# Patient Record
Sex: Male | Born: 2000 | Race: White | Hispanic: No | Marital: Single | Smoking: Never smoker
Health system: Southern US, Community
[De-identification: ages and names within clinical notes are randomized; demographics above are authoritative.]

## PROBLEM LIST (undated history)

## (undated) HISTORY — PX: TONSILLECTOMY: SUR1361

---
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
## 2021-07-01 ENCOUNTER — Other Ambulatory Visit (HOSPITAL_COMMUNITY): Payer: Self-pay

## 2021-07-01 MED ORDER — HYDROCODONE-ACETAMINOPHEN 10-325 MG PO TABS
ORAL_TABLET | ORAL | 0 refills | Status: AC
  Filled 2021-07-01: qty 12, 3d supply, fill #0

## 2021-07-01 MED ORDER — AMOXICILLIN 500 MG PO CAPS
ORAL_CAPSULE | ORAL | 0 refills | Status: AC
  Filled 2021-07-01: qty 15, 5d supply, fill #0

## 2021-08-19 ENCOUNTER — Emergency Department (HOSPITAL_BASED_OUTPATIENT_CLINIC_OR_DEPARTMENT_OTHER)
Admission: EM | Admit: 2021-08-19 | Discharge: 2021-08-19 | Disposition: A | Discharge status: Home / Self Care | Payer: No Typology Code available for payment source | Attending: Emergency Medicine | Admitting: Emergency Medicine

## 2021-08-19 ENCOUNTER — Encounter (HOSPITAL_BASED_OUTPATIENT_CLINIC_OR_DEPARTMENT_OTHER): Payer: Self-pay | Admitting: Emergency Medicine

## 2021-08-19 DIAGNOSIS — W268XXA Contact with other sharp object(s), not elsewhere classified, initial encounter: Secondary | ICD-10-CM | POA: Insufficient documentation

## 2021-08-19 DIAGNOSIS — S61012A Laceration without foreign body of left thumb without damage to nail, initial encounter: Secondary | ICD-10-CM | POA: Insufficient documentation

## 2021-08-19 DIAGNOSIS — S6992XA Unspecified injury of left wrist, hand and finger(s), initial encounter: Secondary | ICD-10-CM | POA: Diagnosis present

## 2021-08-19 MED ORDER — TETANUS-DIPHTH-ACELL PERTUSSIS 5-2.5-18.5 LF-MCG/0.5 IM SUSY: 0.5000 mL | PREFILLED_SYRINGE | Freq: Once | INTRAMUSCULAR | Status: DC

## 2021-08-19 MED ORDER — HYDROCODONE-ACETAMINOPHEN 5-325 MG PO TABS
1.0000 | ORAL_TABLET | Freq: Once | ORAL | Status: AC
  Administered 2021-08-19: 1 via ORAL
  Filled 2021-08-19: qty 1

## 2021-08-19 MED ORDER — LIDOCAINE-EPINEPHRINE (PF) 2 %-1:200000 IJ SOLN
10.0000 mL | Freq: Once | INTRAMUSCULAR | Status: AC
  Administered 2021-08-19: 10 mL
  Filled 2021-08-19: qty 20

## 2021-08-19 NOTE — ED Provider Notes (Signed)
[facility] EMERGENCY DEPT Provider Note   CSN: [id number] Arrival date & time: 08/19/21  1411     History  Chief Complaint  Patient presents with   Laceration    Gregory Chavez is a 21 y.o. male.  Patient with no pertinent past medical history presents today with complaints of left hand injury.  He states that earlier today he was slicing boxes with a box cutter and accidentally cut his left thumb.  Endorses pain and bleeding to same.  He is up-to-date on tetanus.  The history is provided by the patient. No language interpreter was used.  Laceration      Home Medications Prior to Admission medications   Medication Sig Start Date End Date Taking? Authorizing Provider  acetaminophen (TYLENOL) 325 MG tablet Take 2 tablets (650 mg total) by mouth every 6 (six) hours as needed. 10/31/15   Sherrilee Gilles, NP  acetaminophen (TYLENOL) 500 MG tablet Take 500 mg by mouth every 6 (six) hours as needed.    [provider]  amoxicillin (AMOXIL) 500 MG capsule Take 1 capsule by mouth 3 times a day until gone. 07/01/21     HYDROcodone-acetaminophen (NORCO) 10-325 MG tablet Take 1 tablet by mouth every 6 hours as needed for pain. 07/01/21     ibuprofen (ADVIL,MOTRIN) 600 MG tablet Take 1 tab PO Q6H x 1-2 days then Q6H prn pain 04/23/16   Lowanda Foster, NP      Allergies    Patient has no known allergies.    Review of Systems   Review of Systems  Skin:  Positive for wound.  All other systems reviewed and are negative.   Physical Exam Updated Vital Signs BP 127/74 (BP Location: Right Arm)   Pulse 93   Temp 98.6 F (37 C)   Resp 20   SpO2 100%  Physical Exam Vitals and nursing note reviewed.  Constitutional:      General: He is not in acute distress.    Appearance: Normal appearance. He is normal weight. He is not ill-appearing, toxic-appearing or diaphoretic.  HENT:     Head: Normocephalic and atraumatic.  Cardiovascular:     Rate and Rhythm:  Normal rate.  Pulmonary:     Effort: Pulmonary effort is normal. No respiratory distress.  Musculoskeletal:        General: Normal range of motion.     Cervical back: Normal range of motion.     Comments: 5/5 strength and sensation intact to the left hand. Able to flex and extend without difficulty.  Capillary refill less than 2 seconds.  Skin:    General: Skin is warm and dry.     Comments: 5 cm gaping laceration noted to the dorsal aspect of the base of the left thumb.  No active bleeding or signs of foreign body.  Neurological:     General: No focal deficit present.     Mental Status: He is alert.  Psychiatric:        Mood and Affect: Mood normal.        Behavior: Behavior normal.     ED Results / Procedures / Treatments   Labs (all labs ordered are listed, but only abnormal results are displayed) Labs Reviewed - No data to display  EKG None  Radiology No results found.  Procedures .Marland KitchenLaceration Repair  Date/Time: 08/19/2021 3:47 PM  Performed by: Silva Bandy, PA-C Authorized by: Silva Bandy, PA-C   Consent:    Consent obtained:  Verbal  Consent given by:  Patient   Risks, benefits, and alternatives were discussed: yes     Risks discussed:  Infection, pain, retained foreign body, need for additional repair, poor cosmetic result, tendon damage, nerve damage, poor wound healing and vascular damage   Alternatives discussed:  No treatment Universal protocol:    Procedure explained and questions answered to patient or proxy's satisfaction: yes     Patient identity confirmed:  Verbally with patient Anesthesia:    Anesthesia method:  Local infiltration   Local anesthetic:  Lidocaine 2% WITH epi Laceration details:    Location:  Finger   Finger location:  L thumb   Length (cm):  5   Depth (mm):  3 Treatment:    Area cleansed with:  Soap and water   Amount of cleaning:  Standard   Irrigation solution:  Sterile saline   Irrigation volume:  500 ml   Irrigation  method:  Pressure wash Skin repair:    Repair method:  Sutures   Suture size:  4-0   Suture material:  Prolene   Suture technique:  Simple interrupted   Number of sutures:  6 Approximation:    Approximation:  Close Repair type:    Repair type:  Simple Post-procedure details:    Dressing:  Antibiotic ointment and non-adherent dressing   Procedure completion:  Tolerated well, no immediate complications     Medications Ordered in ED Medications  lidocaine-EPINEPHrine (XYLOCAINE W/EPI) 2 %-1:200000 (PF) injection 10 mL (10 mLs Infiltration Given by Other 08/19/21 1455)  HYDROcodone-acetaminophen (NORCO/VICODIN) 5-325 MG per tablet 1 tablet (1 tablet Oral Given 08/19/21 1454)    ED Course/ Medical Decision Making/ A&P                           Medical Decision Making Risk Prescription drug management.   Patient presents today with with left thumb laceration from a box cutter earlier today.  Pressure irrigation performed of wound. Wound explored and base of wound visualized in a bloodless field without evidence of foreign body.  Laceration occurred < 8 hours prior to repair which was well tolerated.  Tdap up-to-date.  Pt has no comorbidities to effect normal wound healing. Pt discharged without antibiotics.  Discussed suture home care with patient and answered questions. Pt to follow-up for wound check and suture removal in 7 days; they are to return to the ED sooner for signs of infection. Pt is hemodynamically stable with no complaints prior to dc.  Patient is understanding and amenable with plan, discharged in stable condition.   This is a shared visit with supervising physician Dr. Rubin Payor who has independently evaluated patient & provided guidance in evaluation/management/disposition, in agreement with care   Final Clinical Impression(s) / ED Diagnoses Final diagnoses:  Laceration of left thumb without foreign body without damage to nail, initial encounter    Rx / DC Orders ED  Discharge Orders     None     An After Visit Summary was printed and given to the patient.     Vear Clock 08/19/21 1552    Benjiman Core, MD 08/20/21 [phone]

## 2021-08-19 NOTE — ED Triage Notes (Signed)
Pt reports slicing his hand while opening a box today. Laceration to palm of hand. Bleeding controlled at this time.

## 2021-08-19 NOTE — Discharge Instructions (Signed)
6 nonabsorbable sutures were placed in your left hand today.  These will need to come out in 7 to 10 days.  You can do this at urgent care, your primary care doctor, or you can return here to have this done.  In the interim, please shower normally and rinse with soap and water.  Please pat dry and do not scrub the wound.  Monitor for signs of infection, these would be reasons to return to the emergency department for further evaluation and management.  You may also take Tylenol/ibuprofen as needed for pain.  Return if development of any new or worsening symptoms.

## 2022-03-16 ENCOUNTER — Ambulatory Visit: Payer: 59 | Admitting: Cardiovascular Disease

## 2022-04-06 ENCOUNTER — Ambulatory Visit: Payer: 59 | Admitting: Cardiovascular Disease

## 2022-07-28 ENCOUNTER — Ambulatory Visit: Payer: 59 | Attending: Internal Medicine

## 2022-07-28 ENCOUNTER — Telehealth: Payer: Self-pay

## 2022-07-28 DIAGNOSIS — I471 Supraventricular tachycardia, unspecified: Secondary | ICD-10-CM

## 2022-07-28 NOTE — Telephone Encounter (Signed)
Pt Dr. Graciela Husbands, pt need a 14 day zio and follow up appointment. Monitor order placed and appointment scheduled for 09/08/22. Pt made aware

## 2022-08-19 DIAGNOSIS — I471 Supraventricular tachycardia, unspecified: Secondary | ICD-10-CM | POA: Diagnosis not present

## 2022-08-29 DIAGNOSIS — I471 Supraventricular tachycardia, unspecified: Secondary | ICD-10-CM | POA: Insufficient documentation

## 2022-09-08 ENCOUNTER — Encounter: Payer: Self-pay | Admitting: Internal Medicine

## 2022-09-08 ENCOUNTER — Telehealth: Payer: Self-pay | Admitting: Internal Medicine

## 2022-09-08 ENCOUNTER — Ambulatory Visit: Payer: 59 | Attending: Internal Medicine | Admitting: Internal Medicine

## 2022-09-08 VITALS — BP 122/76 | HR 87 | Ht 73.0 in | Wt 165.0 lb

## 2022-09-08 DIAGNOSIS — I471 Supraventricular tachycardia, unspecified: Secondary | ICD-10-CM | POA: Diagnosis not present

## 2022-09-08 NOTE — Telephone Encounter (Signed)
  Patient Consent for Virtual Visit        Gregory Chavez has provided verbal consent on 09/08/2022 for a virtual visit (video or telephone).   CONSENT FOR VIRTUAL VISIT FOR:  Gregory Chavez  By participating in this virtual visit I agree to the following:  I hereby voluntarily request, consent and authorize [facility] and its employed or contracted physicians, physician assistants, nurse practitioners or other licensed health care professionals (the Practitioner), to provide me with telemedicine health care services (the "Services") as deemed necessary by the treating Practitioner. I acknowledge and consent to receive the Services by the Practitioner via telemedicine. I understand that the telemedicine visit will involve communicating with the Practitioner through live audiovisual communication technology and the disclosure of certain medical information by electronic transmission. I acknowledge that I have been given the opportunity to request an in-person assessment or other available alternative prior to the telemedicine visit and am voluntarily participating in the telemedicine visit.  I understand that I have the right to withhold or withdraw my consent to the use of telemedicine in the course of my care at any time, without affecting my right to future care or treatment, and that the Practitioner or I may terminate the telemedicine visit at any time. I understand that I have the right to inspect all information obtained and/or recorded in the course of the telemedicine visit and may receive copies of available information for a reasonable fee.  I understand that some of the potential risks of receiving the Services via telemedicine include:  Delay or interruption in medical evaluation due to technological equipment failure or disruption; Information transmitted may not be sufficient (e.g. poor resolution of images) to allow for appropriate medical decision making by the  Practitioner; and/or  In rare instances, security protocols could fail, causing a breach of personal health information.  Furthermore, I acknowledge that it is my responsibility to provide information about my medical history, conditions and care that is complete and accurate to the best of my ability. I acknowledge that Practitioner's advice, recommendations, and/or decision may be based on factors not within their control, such as incomplete or inaccurate data provided by me or distortions of diagnostic images or specimens that may result from electronic transmissions. I understand that the practice of medicine is not an exact science and that Practitioner makes no warranties or guarantees regarding treatment outcomes. I acknowledge that a copy of this consent can be made available to me via my patient portal [facility] MyChart), or I can request a printed copy by calling the office of [facility].    I understand that my insurance will be billed for this visit.   I have read or had this consent read to me. I understand the contents of this consent, which adequately explains the benefits and risks of the Services being provided via telemedicine.  I have been provided ample opportunity to ask questions regarding this consent and the Services and have had my questions answered to my satisfaction. I give my informed consent for the services to be provided through the use of telemedicine in my medical care

## 2022-09-08 NOTE — Patient Instructions (Signed)
Medication Instructions:  The current medical regimen is effective;  continue present plan and medications.  *If you need a refill on your cardiac medications before your next appointment, please call your pharmacy*   Follow-Up: At [facility], you and your health needs are our priority.  As part of our continuing mission to provide you with exceptional heart care, we have created designated Provider Care Teams.  These Care Teams include your primary Cardiologist (physician) and Advanced Practice Providers (APPs -  Physician Assistants and Nurse Practitioners) who all work together to provide you with the care you need, when you need it.  We recommend signing up for the patient portal called "MyChart".  Sign up information is provided on this After Visit Summary.  MyChart is used to connect with patients for Virtual Visits (Telemedicine).  Patients are able to view lab/test results, encounter notes, upcoming appointments, etc.  Non-urgent messages can be sent to your provider as well.   To learn more about what you can do with MyChart, go to [email or web address].    Your next appointment:   3 month(s)  Provider:   Sherryl Manges, MD  (phone visit)  Other Instructions Recommended salt supplementation.  The goal is about 7-10 g of sodium, not sodium chloride, a day.  Salt supplements include oral ThermaTabs, buffered sodium preparation, SaltStick Vitassium,  Other options include NUUN.  This comes in pill form and is dissolved in liquids.  Other liquid preparations include liquid IV, Pedialyte advance care, TRI-oral Also discussed the role of compression wear including thigh sleeves and abdominal binders.  Calf compression is not specifically recommended.Marland Kitchen

## 2022-09-08 NOTE — Progress Notes (Signed)
ELECTROPHYSIOLOGY OFFICE  NOTE  Patient ID: Jamarrion Longaker, MRN: [id number], DOB/AGE: September 04, 2000 21 y.o. Admit date: (Not on file) Date of Consult: 09/08/2022  Primary Physician: Patient, No Pcp Per Primary Cardiologist: new     Braun Barno is a 22 y.o. male who is being seen today for the evaluation of palps at the request of His mom Misty Stanley).    HPI Sharan Ashmead is a 22 y.o. 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  Since that time he had no recurrent tachypalpitations at the previous rates i.e. about 200 but he has had episodes frequently of heart rates in the 130-150s.  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  They are associated with diaphoresis nausea flushing pallor and residual fatigue.  Some orthostatic intolerance.  Diet is fluid replete but salt deplete        History reviewed. No pertinent past medical history.    Surgical History:  Past Surgical History:  Procedure Laterality Date   TONSILLECTOMY       Home Meds: No outpatient medications have been marked as taking for the 09/08/22 encounter (Office Visit) with Duke Salvia, MD.    Allergies: No Known Allergies  Social History   Socioeconomic History   Marital status: Single    Spouse name: Not on file   Number of children: Not on file   Years of education: Not on file   Highest education level: Not on file  Occupational History   Not on file  Tobacco Use   Smoking status: Never   Smokeless tobacco: Never  Substance and Sexual Activity   Alcohol use: Not on file   Drug use: No   Sexual activity: Never    Birth control/protection: None  Other Topics Concern   Not on file  Social  History Narrative   Not on file   Social Determinants of Health   Financial Resource Strain: Not on file  Food Insecurity: Not on file  Transportation Needs: Not on file  Physical Activity: Not on file  Stress: Not on file  Social Connections: Not on file  Intimate Partner Violence: Not on file     History reviewed. No pertinent family history.   ROS:  Please see the history of present illness.     All other systems reviewed and negative.    Physical Exam:  Blood pressure 104/70, pulse 81, height 6\' 1"  (1.854 m), weight 165 lb (74.8 kg), SpO2 96%. General: Well developed, well nourished male in no acute distress. Head: Normocephalic, atraumatic, sclera non-icteric, no xanthomas, nares are without discharge. EENT: normal  Lymph Nodes:  none Neck: Negative for carotid bruits. JVD not elevated. Back:without scoliosis kyphosis Lungs: Clear bilaterally to auscultation without wheezes, rales, or rhonchi. Breathing is unlabored. Heart: RRR with S1 S2 no  murmur . No rubs, or gallops appreciated. Abdomen: Soft, non-tender, non-distended with normoactive bowel sounds. No hepatomegaly. No rebound/guarding. No obvious abdominal masses. Msk:  Strength and tone appear normal for age. Extremities: No clubbing or cyanosis. No  edema.  Distal pedal pulses are 2+ and equal bilaterally. Skin: Warm and Dry Neuro: Alert and oriented X 3. CN III-XII intact Grossly normal sensory and motor function .  Psych:  Responds to questions appropriately with a normal affect.        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  This is  reassuring  Patient has mild orthostatic intolerance objectively and symptoms consistent with autonomic insufficiency. We discussed extensively the issues of dysautonomia, the physiology of orthstasis and positional stress.  We discussed the role of salt and water repletion,   Discussed salt substitutes with a  goal of 3-4 gms of Sodium or 12-15 gm of sodium Chloride daily.  Rehydration solutions include Liquid IV, NUUN, TriOral, Normralyte pedialyte advanced care and Banana Bags.  Salt tablets include plain salt tablets, SaltStick Vitassium, thermatabs amongst others.   The higher sodium concentration per serving on this list is normalyte about 800 mg of sodium per serving LMNT1000 mg and most recently of worried about Within You hydration formula also at 1000 mg  Salt supplements are best used with adjunctive sugar  Also discussed the role of compression wear including thigh sleeves potentially in the form of compression pants particularly when he is working in the inside of the building        [employer]

## 2022-12-29 ENCOUNTER — Telehealth: Payer: 59 | Admitting: Internal Medicine

## 2023-03-10 ENCOUNTER — Other Ambulatory Visit: Payer: Self-pay

## 2023-03-10 ENCOUNTER — Ambulatory Visit: Payer: 59 | Attending: Internal Medicine

## 2023-03-10 DIAGNOSIS — I471 Supraventricular tachycardia, unspecified: Secondary | ICD-10-CM

## 2024-03-14 ENCOUNTER — Other Ambulatory Visit (HOSPITAL_COMMUNITY): Payer: Self-pay

## 2024-03-14 ENCOUNTER — Telehealth: Admitting: Emergency Medicine

## 2024-03-14 DIAGNOSIS — J309 Allergic rhinitis, unspecified: Secondary | ICD-10-CM

## 2024-03-14 MED ORDER — CETIRIZINE HCL 10 MG PO TABS
10.0000 mg | ORAL_TABLET | Freq: Every day | ORAL | 11 refills | Status: AC
  Filled 2024-03-14: qty 30, 30d supply, fill #0

## 2024-03-14 MED ORDER — ALBUTEROL SULFATE HFA 108 (90 BASE) MCG/ACT IN AERS
2.0000 | INHALATION_SPRAY | RESPIRATORY_TRACT | 3 refills | Status: AC | PRN
  Filled 2024-03-14: qty 6.7, 10d supply, fill #0
  Filled 2024-03-14: qty 6.7, 17d supply, fill #0

## 2024-03-14 MED ORDER — FLUTICASONE PROPIONATE 50 MCG/ACT NA SUSP
2.0000 | Freq: Every day | NASAL | 0 refills | Status: AC
  Filled 2024-03-14: qty 16, 60d supply, fill #0

## 2024-03-14 MED ORDER — BENZONATATE 100 MG PO CAPS
100.0000 mg | ORAL_CAPSULE | Freq: Two times a day (BID) | ORAL | 0 refills | Status: AC | PRN
  Filled 2024-03-14: qty 20, 10d supply, fill #0

## 2024-03-14 NOTE — Addendum Note (Signed)
 Addended by: VICKY LAMAR NOVAK on: 03/14/2024 08:54 AM   Modules accepted: Orders

## 2024-03-14 NOTE — Progress Notes (Signed)
 E visit for Allergic Rhinitis We are sorry that you are not feeling well.  Here is how we plan to help!  Based on what you have shared with me, it looks like you have Allergic Rhinitis.  Rhinitis is when a reaction occurs that causes nasal congestion, runny nose, sneezing, and itching.    There are several types of rhinitis, with the most common being acute rhinitis, allergic rhinitis, and nonallergic rhinitis. Acute rhinitis is typically caused by a viral infection. Allergic rhinitis, also known as seasonal rhinitis, occurs at certain times of the year when airborne allergens--such as pollen--trigger the immune system to release histamine. This chemical causes symptoms like itching, swelling, and fluid buildup in the delicate linings of the nasal passages, sinuses, and eyelids. As a result, individuals often experience an itchy nose and a clear nasal discharge. Nonallergic rhinitis, on the other hand, is not triggered by allergens and tends to persist year-round.   You should take a daily antihistamine. I have prescribed:Cetirizine  10 mg -- Take 1 tablet daily.  A nasal spray would also be beneficial. I have prescribed:Flonase  --  2 sprays into each nostril once daily.  You may also benefit from eye drops such as: Systane 1-2 drops each eye twice daily as needed (over-the-counter).  I've also sent an inhaler for wheezing.  HOME CARE:  You can use an over-the-counter saline nasal spray as needed. Avoid areas where there is heavy dust, mites, or molds. Stay indoors on windy days during the pollen season. Keep windows closed in home, at least in bedroom; use air conditioner. Keep house pets out of the bedroom or at least off of your bed. Use high-efficiency house air filters in the home. Keep windows closed in car, turn AC on re-circulate. Avoid playing outside with animals during peak pollen season.  GET HELP RIGHT AWAY IF:  If your symptoms do not improve within 10 days. You become short  of breath. You develop yellow or green discharge from your nose for over 3 days. You have coughing fits.  MAKE SURE YOU:  Understand these instructions. Monitor your symptoms and get help right away if anything continues to worsen despite treatment.  Thank you for choosing an e-visit.   Your e-visit answers were reviewed by a board certified advanced clinical practitioner to complete your personal care plan. Depending upon the condition, your plan could have included both over the counter or prescription medications.   Please review your pharmacy choice. Be sure that the pharmacy you have chosen is open so that you can pick up your prescription now.  If there is a problem, you may message your provider in MyChart to have the prescription routed to another pharmacy.   Your safety is important to us . If you have drug allergies, check your prescription carefully.    For the next 24 hours, you can use MyChart to ask questions about todays visit, request a non-urgent call back, or ask for a work or school excuse from your e-visit provider.   You will receive an email in the next two days asking about your experience. I hope that your e-visit has been valuable and will speed up your recovery.    I have spent 5 minutes in review of e-visit questionnaire, review and updating patient chart, medical decision making and response to patient.   Lamar Schlossman, PA-C
